# Patient Record
Sex: Female | Born: 2000 | Race: Black or African American | Hispanic: No | Marital: Single | Smoking: Never smoker
Health system: Southern US, Community
[De-identification: ages and names within clinical notes are randomized; demographics above are authoritative.]

---
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
## 2018-01-01 ENCOUNTER — Other Ambulatory Visit: Payer: Self-pay

## 2018-01-01 ENCOUNTER — Encounter (HOSPITAL_COMMUNITY): Payer: Self-pay | Admitting: Emergency Medicine

## 2018-01-01 ENCOUNTER — Emergency Department (HOSPITAL_COMMUNITY)
Admission: EM | Admit: 2018-01-01 | Discharge: 2018-01-02 | Disposition: A | Discharge status: Home / Self Care | Payer: No Typology Code available for payment source | Attending: Emergency Medicine | Admitting: Emergency Medicine

## 2018-01-01 DIAGNOSIS — Y998 Other external cause status: Secondary | ICD-10-CM | POA: Diagnosis not present

## 2018-01-01 DIAGNOSIS — Y92410 Unspecified street and highway as the place of occurrence of the external cause: Secondary | ICD-10-CM | POA: Insufficient documentation

## 2018-01-01 DIAGNOSIS — S0083XA Contusion of other part of head, initial encounter: Secondary | ICD-10-CM | POA: Diagnosis not present

## 2018-01-01 DIAGNOSIS — Y939 Activity, unspecified: Secondary | ICD-10-CM | POA: Insufficient documentation

## 2018-01-01 DIAGNOSIS — R51 Headache: Secondary | ICD-10-CM | POA: Diagnosis present
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
## 2018-01-02 ENCOUNTER — Emergency Department (HOSPITAL_COMMUNITY): Payer: No Typology Code available for payment source

## 2018-01-02 NOTE — Discharge Instructions (Signed)
Take Ibuprofen or Tylenol as needed for pain Use a heating pad for sore muscles - use for 20 minutes several times a day Return for worsening symptoms

## 2023-02-08 ENCOUNTER — Other Ambulatory Visit (HOSPITAL_BASED_OUTPATIENT_CLINIC_OR_DEPARTMENT_OTHER): Payer: Self-pay

## 2023-02-08 ENCOUNTER — Other Ambulatory Visit: Payer: Self-pay

## 2023-02-08 ENCOUNTER — Emergency Department (HOSPITAL_BASED_OUTPATIENT_CLINIC_OR_DEPARTMENT_OTHER)
Admission: EM | Admit: 2023-02-08 | Discharge: 2023-02-08 | Disposition: A | Discharge status: Home / Self Care | Payer: BC Managed Care – PPO | Attending: Emergency Medicine | Admitting: Emergency Medicine

## 2023-02-08 ENCOUNTER — Emergency Department (HOSPITAL_BASED_OUTPATIENT_CLINIC_OR_DEPARTMENT_OTHER): Payer: BC Managed Care – PPO

## 2023-02-08 DIAGNOSIS — M545 Low back pain, unspecified: Secondary | ICD-10-CM | POA: Diagnosis not present

## 2023-02-08 DIAGNOSIS — R1031 Right lower quadrant pain: Secondary | ICD-10-CM | POA: Diagnosis not present

## 2023-02-08 DIAGNOSIS — R1030 Lower abdominal pain, unspecified: Secondary | ICD-10-CM | POA: Diagnosis present

## 2023-02-08 LAB — URINALYSIS, ROUTINE W REFLEX MICROSCOPIC
Bilirubin Urine: NEGATIVE
Glucose, UA: NEGATIVE mg/dL
Hgb urine dipstick: NEGATIVE
Ketones, ur: NEGATIVE mg/dL
Nitrite: NEGATIVE
Specific Gravity, Urine: 1.019 (ref 1.005–1.030)
pH: 5.5 (ref 5.0–8.0)

## 2023-02-08 LAB — WET PREP, GENITAL
Clue Cells Wet Prep HPF POC: NONE SEEN
Sperm: NONE SEEN
Trich, Wet Prep: NONE SEEN
WBC, Wet Prep HPF POC: >=10 — AB (ref <10)
Yeast Wet Prep HPF POC: NONE SEEN

## 2023-02-08 LAB — COMPREHENSIVE METABOLIC PANEL
ALT: 8 U/L (ref 0–44)
AST: 15 U/L (ref 15–41)
Albumin: 4.5 g/dL (ref 3.5–5.0)
Alkaline Phosphatase: 64 U/L (ref 38–126)
Anion gap: 8 (ref 5–15)
BUN: 7 mg/dL (ref 6–20)
CO2: 27 mmol/L (ref 22–32)
Calcium: 9.7 mg/dL (ref 8.9–10.3)
Chloride: 103 mmol/L (ref 98–111)
Creatinine, Ser: 0.84 mg/dL (ref 0.44–1.00)
GFR, Estimated: >60 mL/min (ref >60)
Glucose, Bld: 98 mg/dL (ref 70–99)
Potassium: 3.6 mmol/L (ref 3.5–5.1)
Sodium: 138 mmol/L (ref 135–145)
Total Bilirubin: 0.9 mg/dL (ref 0.3–1.2)
Total Protein: 7.4 g/dL (ref 6.5–8.1)

## 2023-02-08 LAB — CBC
HCT: 40.5 % (ref 36.0–46.0)
Hemoglobin: 13.3 g/dL (ref 12.0–15.0)
MCH: 28.8 pg (ref 26.0–34.0)
MCHC: 32.8 g/dL (ref 30.0–36.0)
MCV: 87.7 fL (ref 80.0–100.0)
Platelets: 222 10*3/uL (ref 150–400)
RBC: 4.62 MIL/uL (ref 3.87–5.11)
RDW: 12 % (ref 11.5–15.5)
WBC: 10.6 10*3/uL — ABNORMAL HIGH (ref 4.0–10.5)
nRBC: 0 % (ref 0.0–0.2)

## 2023-02-08 LAB — PREGNANCY, URINE: Preg Test, Ur: NEGATIVE

## 2023-02-08 LAB — LIPASE, BLOOD: Lipase: <5 U/L — ABNORMAL LOW (ref 11–51)

## 2023-02-08 MED ORDER — IBUPROFEN 600 MG PO TABS
600.0000 mg | ORAL_TABLET | Freq: Three times a day (TID) | ORAL | 0 refills | Status: DC | PRN
  Filled 2023-02-08: qty 30, 10d supply, fill #0

## 2023-02-08 MED ORDER — ONDANSETRON HCL 8 MG PO TABS: 8.0000 mg | ORAL_TABLET | Freq: Three times a day (TID) | ORAL | 0 refills | Status: AC | PRN

## 2023-02-08 MED ORDER — IBUPROFEN 600 MG PO TABS: 600.0000 mg | ORAL_TABLET | Freq: Three times a day (TID) | ORAL | 0 refills | Status: AC | PRN

## 2023-02-08 MED ORDER — KETOROLAC TROMETHAMINE 15 MG/ML IJ SOLN
15.0000 mg | Freq: Once | INTRAMUSCULAR | Status: AC
  Administered 2023-02-08: 15 mg via INTRAVENOUS
  Filled 2023-02-08: qty 1

## 2023-02-08 NOTE — ED Notes (Signed)
Discharge paperwork given and verbally understood. 

## 2023-02-08 NOTE — ED Triage Notes (Addendum)
Pt presents from home for low back and lower abd pain for the past few months.  Endorses intermittent burning with urination, ringing in ear, N/V, HA.  Denies fever  No daily medications.   LMP last week  No h/o abd surgery  Last BM yesterday

## 2023-02-08 NOTE — ED Provider Notes (Signed)
[place] EMERGENCY DEPARTMENT AT [facility] Provider Note   CSN: [id number] Arrival date & time: 02/08/23  0165     History  Chief Complaint  Patient presents with   Abdominal Pain    Briana Cunningham is a 22 y.o. female.  HPI    22 year old female comes in with chief complaint of abdominal pain. Patient states that she has had off-and-on abdominal pain for several months now.  Over the last month, the pain has become more frequent and she actually had gone to [facility], and was told that she could have IBS.  She states that her pain is located in the lower abdominal region, it is sharp, throbbing and cramping, but not consistent with her.  Pain.  Her last period was a week ago.  She denies any vaginal discharge or bleeding, but has intermittent episodes of burning with urination.  Review of system is also positive for nausea without vomiting and some headaches.  Patient does not have a PCP.  She has no known medical problems.  She denies any history of heavy smoking, drinking, substance use disorder and has no high risk behavior for STI or history of STI.  Patient denies any specific triggers, aggravating or relieving factors. Home Medications Prior to Admission medications   Medication Sig Start Date End Date Taking? Authorizing Provider  dicyclomine (BENTYL) 10 MG capsule Take 10 mg by mouth every 6 (six) hours as needed. 01/25/23  Yes [provider]  nystatin (MYCOSTATIN) 100000 UNIT/ML suspension Take by mouth. 01/31/23  Yes [provider]      Allergies    Patient has no known allergies.    Review of Systems   Review of Systems  All other systems reviewed and are negative.   Physical Exam Updated Vital Signs BP 111/74 (BP Location: Right Arm)   Pulse 73   Temp 98.1 F (36.7 C) (Oral)   Resp 16   Wt 86 kg   LMP 02/01/2023 (Approximate)   SpO2 100%   BMI 27.20 kg/m  Physical Exam Vitals and nursing note reviewed.  Constitutional:       Appearance: She is well-developed.  HENT:     Head: Atraumatic.  Cardiovascular:     Rate and Rhythm: Normal rate.  Pulmonary:     Effort: Pulmonary effort is normal.  Abdominal:     Tenderness: There is abdominal tenderness in the right lower quadrant and suprapubic area. There is no guarding or rebound.  Musculoskeletal:     Cervical back: Normal range of motion and neck supple.  Skin:    General: Skin is warm and dry.  Neurological:     Mental Status: She is alert and oriented to person, place, and time.     ED Results / Procedures / Treatments   Labs (all labs ordered are listed, but only abnormal results are displayed) Labs Reviewed  WET PREP, GENITAL - Abnormal; Notable for the following components:      Result Value   WBC, Wet Prep HPF POC >=10 (*)    All other components within normal limits  LIPASE, BLOOD - Abnormal; Notable for the following components:   Lipase <5 (*)    All other components within normal limits  CBC - Abnormal; Notable for the following components:   WBC 10.6 (*)    All other components within normal limits  URINALYSIS, ROUTINE W REFLEX MICROSCOPIC - Abnormal; Notable for the following components:   Protein, ur TRACE (*)    Leukocytes,Ua  TRACE (*)    Bacteria, UA RARE (*)    All other components within normal limits  COMPREHENSIVE METABOLIC PANEL  PREGNANCY, URINE  GC/CHLAMYDIA PROBE AMP ([place]) NOT AT [facility]    EKG None  Radiology US PELVIC COMPLETE WITH TRANSVAGINAL  Result Date: 02/08/2023 CLINICAL DATA:  [id number] Pain [id number] EXAM: ULTRASOUND OF PELVIS TECHNIQUE: Transabdominal and transvaginalultrasound examination of the pelvis was performed including evaluation of the uterus, ovaries, adnexal regions, and pelvic cul-de-sac. COMPARISON:  None Available. FINDINGS: Uterusanteverted, 8 x 4 x 3 cm. The endometrium unremarkable, 0.6 cm. The uterine cavity is empty. There are no uterine masses. Right ovary Unremarkable, 3.9 x 3.1 x 2.4  cm. Left ovary Unremarkable, 3.3 x 2.8 x 1.8 cm. Images of the adnexae demonstrated no masses or fluid collections. IMPRESSION: Unremarkable examination of the pelvis. Electronically Signed   By: Layla MawJoshua  Pleasure M.D.   On: 02/08/2023 09:24    Procedures Procedures    Medications Ordered in ED Medications  ketorolac (TORADOL) 15 MG/ML injection 15 mg (15 mg Intravenous Given 02/08/23 [id number])    ED Course/ Medical Decision Making/ A&P Clinical Course as of 02/08/23 0930  Tue Feb 08, 2023  0928 Wet prep, genital(!) Wet prep is negative for concerning findings. [AN]  [id number] Comprehensive metabolic panel Patient wanted to make sure her kidney function was fine, UA is reassuring but also creatinine and renal function is normal.  All the electrolytes are also fine. [AN]  0929 Pregnancy, urine Negative pregnancy test noted. [AN]  0929 CBC(!) Mildly elevated white count noted.  Urine did not have any pyuria, further reassuring. [AN]  0929 US PELVIC COMPLETE WITH TRANSVAGINAL Ultrasound results reviewed.  Negative for fibroids or cyst. At this time, outpatient follow-up with PCP will be helpful.  This recommendation has been discussed with the patient. [AN]    Clinical Course User Index [AN] Derwood KaplanNanavati, Lisseth Brazeau, MD                             Medical Decision Making Amount and/or Complexity of Data Reviewed Labs: ordered. Decision-making details documented in ED Course. Radiology: ordered. Decision-making details documented in ED Course.  Risk Prescription drug management.  22 year old patient comes in with chief complaint of lower back and abdominal pain.  Patient has no known past medical history.  Her social history is reassuring.  On exam patient has right lower quadrant and suprapubic tenderness, but there is no rebound or guarding.  Negative Murphy's, negative McBurney's.  Patient has intermittent episodes of burning with urination, but no urinary frequency.  She denies any history of  kidney stone and she does not have high risk features for STI and she denies any known pelvic disorder.  I reviewed care everywhere.  It [facility]  She did not require any imaging at that time for abdominal pain.  No recent imaging of abdomen noted.  Differential diagnosis for this patient includes mostly nonemergent condition given that the pain has no peritoneal findings, pain is intermittent, pain has been going on for a while and patient really does not have any high risk factors such as comorbidities or age.  Patient has insurance, I think she needs PCP.  I did have this conversation with the patient so that she can get proper diagnosis.  Patient wants to self swab for STI test by her choice.  Initial plan is to get  basic labs along with a self swab and also ultrasound of the pelvis.  I discussed with the patient my hesitancy at getting a CT scan unless the white count is significantly elevated, as the harm of radiation risk outweighs the benefit at this point.  Patient understands.    Final Clinical Impression(s) / ED Diagnoses Final diagnoses:  Intermittent lower abdominal pain    Rx / DC Orders ED Discharge Orders     None         Derwood Kaplan, MD 02/08/23 [phone]

## 2023-02-08 NOTE — Discharge Instructions (Addendum)
All the results in the ER are normal, labs and imaging. We are not sure what is causing your symptoms. The workup in the ER is not complete, and is limited to screening for life threatening and emergent conditions only, and your symptoms appear to be secondary to chronic condition.  We recommend that you get a primary care doctor soon as possible for further assessment.  Take the medications that are prescribed for symptom relief.

## 2023-02-14 LAB — GC/CHLAMYDIA PROBE AMP (~~LOC~~) NOT AT ARMC
Chlamydia: NEGATIVE
Comment: NEGATIVE
Comment: NORMAL
Neisseria Gonorrhea: NEGATIVE
